# Patient Record
Sex: Male | Born: 1965 | Race: White | Hispanic: No | Marital: Single | Smoking: Current every day smoker
Health system: Southern US, Community
[De-identification: ages and names within clinical notes are randomized; demographics above are authoritative.]

## PROBLEM LIST (undated history)

## (undated) DIAGNOSIS — J45909 Unspecified asthma, uncomplicated: Secondary | ICD-10-CM

---

## 2019-08-12 ENCOUNTER — Emergency Department (HOSPITAL_COMMUNITY)
Admission: EM | Admit: 2019-08-12 | Discharge: 2019-08-12 | Disposition: A | Discharge status: Home / Self Care | Payer: Self-pay | Attending: Emergency Medicine | Admitting: Emergency Medicine

## 2019-08-12 ENCOUNTER — Emergency Department (HOSPITAL_COMMUNITY): Payer: Self-pay

## 2019-08-12 ENCOUNTER — Encounter (HOSPITAL_COMMUNITY): Payer: Self-pay

## 2019-08-12 ENCOUNTER — Other Ambulatory Visit: Payer: Self-pay

## 2019-08-12 DIAGNOSIS — R059 Cough, unspecified: Secondary | ICD-10-CM

## 2019-08-12 DIAGNOSIS — R519 Headache, unspecified: Secondary | ICD-10-CM | POA: Insufficient documentation

## 2019-08-12 DIAGNOSIS — R05 Cough: Secondary | ICD-10-CM | POA: Insufficient documentation

## 2019-08-12 DIAGNOSIS — J45909 Unspecified asthma, uncomplicated: Secondary | ICD-10-CM | POA: Insufficient documentation

## 2019-08-12 DIAGNOSIS — F17200 Nicotine dependence, unspecified, uncomplicated: Secondary | ICD-10-CM | POA: Insufficient documentation

## 2019-08-12 DIAGNOSIS — Z20822 Contact with and (suspected) exposure to covid-19: Secondary | ICD-10-CM

## 2019-08-12 DIAGNOSIS — M791 Myalgia, unspecified site: Secondary | ICD-10-CM | POA: Insufficient documentation

## 2019-08-12 DIAGNOSIS — R5381 Other malaise: Secondary | ICD-10-CM | POA: Insufficient documentation

## 2019-08-12 DIAGNOSIS — Z20828 Contact with and (suspected) exposure to other viral communicable diseases: Secondary | ICD-10-CM | POA: Insufficient documentation

## 2019-08-12 HISTORY — DX: Unspecified asthma, uncomplicated: J45.909

## 2019-08-12 LAB — INFLUENZA PANEL BY PCR (TYPE A & B)
Influenza A By PCR: NEGATIVE
Influenza B By PCR: NEGATIVE

## 2019-08-12 MED ORDER — PROCHLORPERAZINE MALEATE 10 MG PO TABS
10.0000 mg | ORAL_TABLET | Freq: Once | ORAL | Status: AC
  Administered 2019-08-12: 15:00:00 10 mg via ORAL
  Filled 2019-08-12: qty 1

## 2019-08-12 NOTE — Discharge Instructions (Addendum)
You were evaluated in the Emergency Department and after careful evaluation, we did not find any emergent condition requiring admission or further testing in the hospital.  Your exam/testing today is overall reassuring.  We suspect a viral infection, possibly the coronavirus.  We have tested you for the coronavirus as well as the flu and you should be contacted with abnormal results.  Please practice quarantine at home until you receive your results.  If positive you will need to quarantine further per the CDC recommendations.  Please return to the Emergency Department if you experience any worsening of your condition.  We encourage you to follow up with a primary care provider.  Thank you for allowing [place] to be a part of your care.

## 2019-08-12 NOTE — ED Provider Notes (Signed)
[facility] Emergency Department Provider Note MRN:  [id number]  Arrival date & time: 08/12/19     Chief Complaint   Cough and Fatigue   History of Present Illness   Nathaniel Mendez is a 53 y.o. year-old male with a history of asthma presenting to the ED with chief complaint of cough and fatigue.  2 or 3 days of cough, malaise, body aches, dull frontal headache which is gradual onset, denies fever.  Denies chest pain or shortness of breath, no abdominal pain, occasional nausea, no vomiting, no diarrhea.  Patient is currently staying at a recovery center for addiction and there has been a positive corona test.  Review of Systems  A complete 10 system review of systems was obtained and all systems are negative except as noted in the HPI and PMH.   Patient's Health History    Past Medical History:  Diagnosis Date  . Asthma     History reviewed. No pertinent surgical history.  No family history on file.  Social History   Socioeconomic History  . Marital status: Single    Spouse name: Not on file  . Number of children: Not on file  . Years of education: Not on file  . Highest education level: Not on file  Occupational History  . Not on file  Social Needs  . Financial resource strain: Not on file  . Food insecurity    Worry: Not on file    Inability: Not on file  . Transportation needs    Medical: Not on file    Non-medical: Not on file  Tobacco Use  . Smoking status: Current Every Day Smoker  . Smokeless tobacco: Never Used  Substance and Sexual Activity  . Alcohol use: Not Currently    Frequency: Never  . Drug use: Never  . Sexual activity: Not on file  Lifestyle  . Physical activity    Days per week: Not on file    Minutes per session: Not on file  . Stress: Not on file  Relationships  . Social Herbalist on phone: Not on file    Gets together: Not on file    Attends religious service: Not on file    Active member of  club or organization: Not on file    Attends meetings of clubs or organizations: Not on file    Relationship status: Not on file  . Intimate partner violence    Fear of current or ex partner: Not on file    Emotionally abused: Not on file    Physically abused: Not on file    Forced sexual activity: Not on file  Other Topics Concern  . Not on file  Social History Narrative  . Not on file     Physical Exam  Vital Signs and Nursing Notes reviewed Vitals:   08/12/19 1405  BP: 123/76  Pulse: 94  Resp: 20  Temp: 98.1 F (36.7 C)  SpO2: 99%    CONSTITUTIONAL: Well-appearing, NAD NEURO:  Alert and oriented x 3, no focal deficits EYES:  eyes equal and reactive ENT/NECK:  no LAD, no JVD CARDIO: Regular rate, well-perfused, normal S1 and S2 PULM:  CTAB no wheezing or rhonchi GI/GU:  normal bowel sounds, non-distended, non-tender MSK/SPINE:  No gross deformities, no edema SKIN:  no rash, atraumatic PSYCH:  Appropriate speech and behavior  Diagnostic and Interventional Summary    EKG Interpretation  Date/Time:  Wednesday August 12 2019 15:33:51 EDT Ventricular Rate:  67 PR Interval:    QRS Duration: 86 QT Interval:  382 QTC Calculation: 404 R Axis:   79 Text Interpretation: Sinus rhythm Confirmed by Kennis Carina [phone]) on 08/12/2019 3:44:26 PM      Labs Reviewed  NOVEL CORONAVIRUS, NAA (HOSP ORDER, SEND-OUT TO REF LAB; TAT 18-24 HRS)  INFLUENZA PANEL BY PCR (TYPE A & B)    DG Chest Port 1 View  Final Result      Medications  prochlorperazine (COMPAZINE) tablet 10 mg (10 mg Oral Given 08/12/19 1529)     Procedures  /  Critical Care Procedures  ED Course and Medical Decision Making  I have reviewed the triage vital signs and the nursing notes.  Pertinent labs & imaging results that were available during my care of the patient were reviewed by me and considered in my medical decision making (see below for details).  Highly suspicious for COVID-19, also  considering influenza, other viral process.  Will swab for Covid, flu, perform screening EKG and chest x-ray, anticipating discharge.    Elmer Sow. Pilar Plate, MD [facility] Emergency Medicine [facility] [email or web address]  Final Clinical Impressions(s) / ED Diagnoses     ICD-10-CM   1. Exposure to COVID-19 virus  Z20.828   2. Cough  R05 DG Chest Port 1 View    DG Chest Port 1 View    ED Discharge Orders    None      Discharge Instructions Discussed with and Provided to Patient:   Discharge Instructions     You were evaluated in the Emergency Department and after careful evaluation, we did not find any emergent condition requiring admission or further testing in the hospital.  Your exam/testing today is overall reassuring.  We suspect a viral infection, possibly the coronavirus.  We have tested you for the coronavirus as well as the flu and you should be contacted with abnormal results.  Please practice quarantine at home until you receive your results.  If positive you will need to quarantine further per the CDC recommendations.  Please return to the Emergency Department if you experience any worsening of your condition.  We encourage you to follow up with a primary care provider.  Thank you for allowing [place] to be a part of your care.       Sabas Sous, MD 08/12/19 [phone]

## 2019-08-12 NOTE — ED Notes (Signed)
X ray in room.

## 2019-08-12 NOTE — ED Triage Notes (Signed)
Pt presents with c/o cough, fatigue, body aches for approx one week. Pt also c/o night sweats and diarrhea. Pt reports he lives in a sober living facility and a resident tested positive for Covid last week.

## 2019-08-13 LAB — NOVEL CORONAVIRUS, NAA (HOSP ORDER, SEND-OUT TO REF LAB; TAT 18-24 HRS): SARS-CoV-2, NAA: NOT DETECTED

## 2019-08-18 ENCOUNTER — Telehealth (HOSPITAL_COMMUNITY): Payer: Self-pay

## 2020-08-30 IMAGING — DX DG CHEST 1V PORT
1 series · 1 of 1 positions shown · non-contrast
Comparison: None.

CLINICAL DATA: Cough, fatigue, body aches

EXAM:
PORTABLE CHEST 1 VIEW

[chest ap]
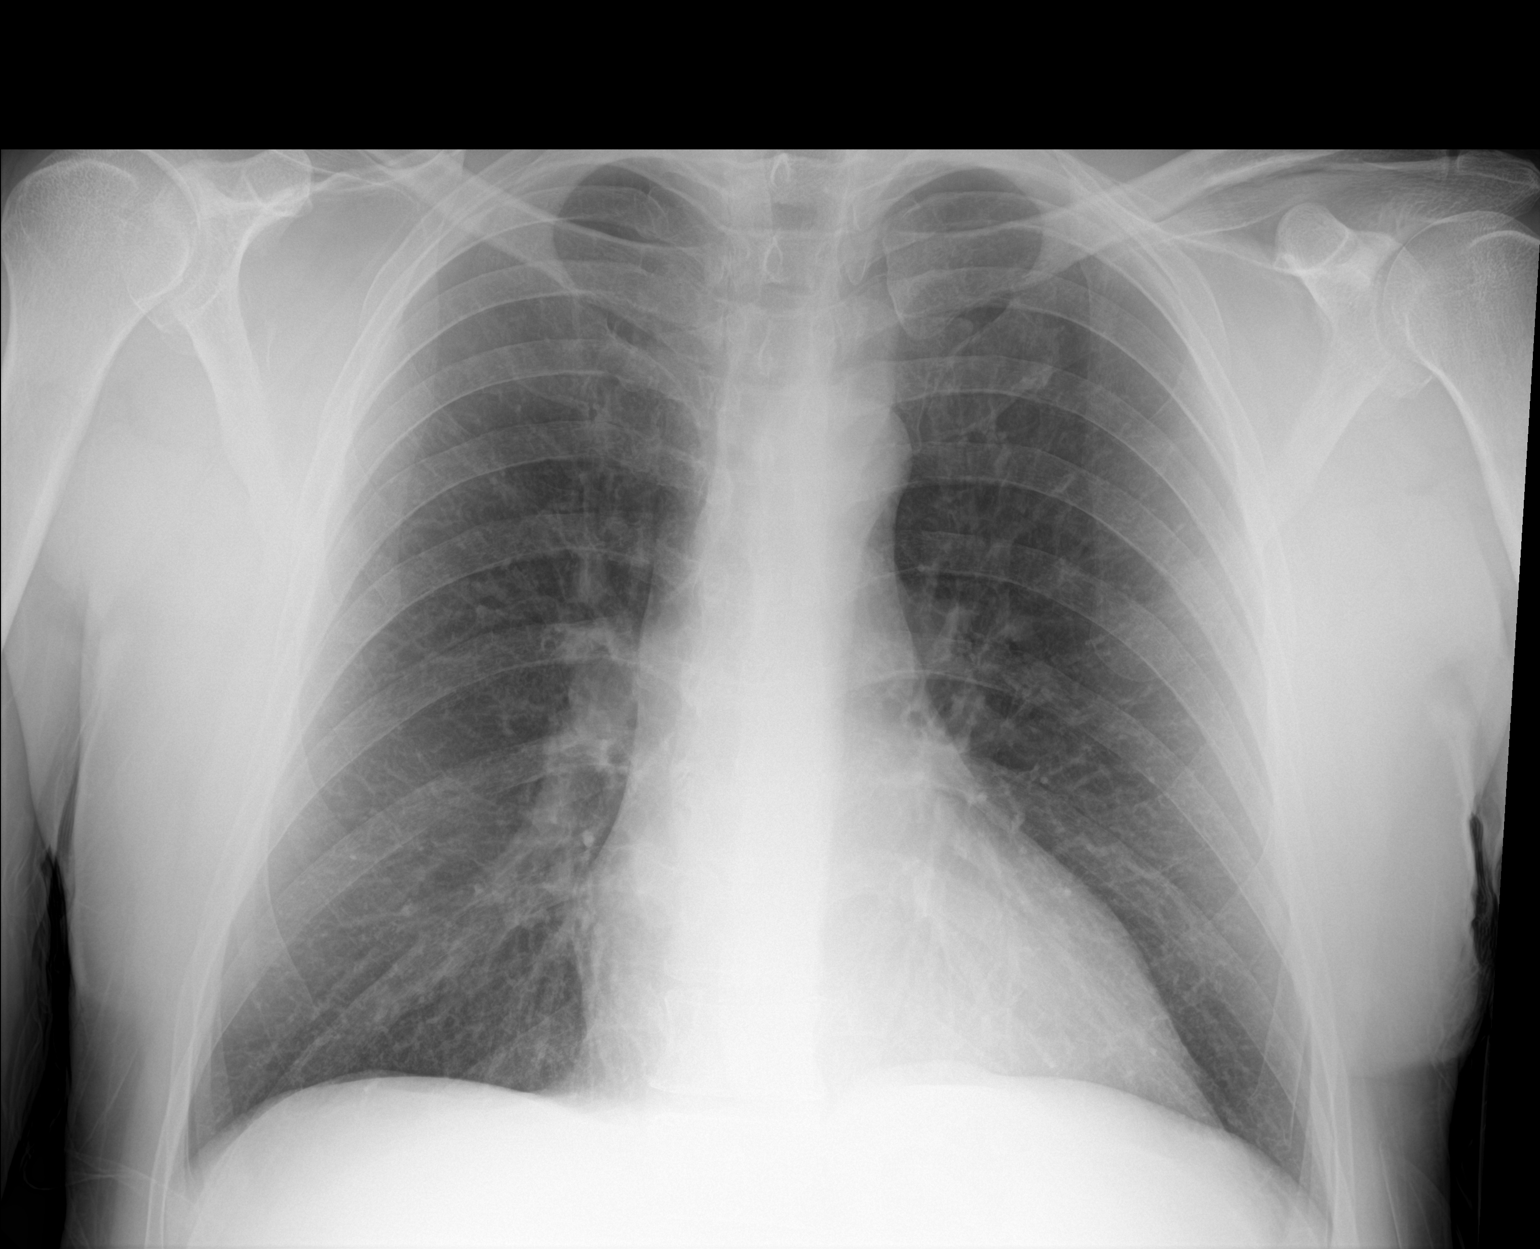

[1 of 1 positions shown; findings below may reference images not displayed]

FINDINGS: The heart size and mediastinal contours are within normal limits.
Both lungs are clear. The visualized skeletal structures are
unremarkable.
IMPRESSION: No active disease.

## 2021-01-10 ENCOUNTER — Other Ambulatory Visit: Payer: Self-pay

## 2021-01-10 ENCOUNTER — Emergency Department (HOSPITAL_COMMUNITY)
Admission: EM | Admit: 2021-01-10 | Discharge: 2021-01-10 | Disposition: A | Discharge status: Home / Self Care | Payer: Self-pay | Attending: Emergency Medicine | Admitting: Emergency Medicine

## 2021-01-10 DIAGNOSIS — Z760 Encounter for issue of repeat prescription: Secondary | ICD-10-CM | POA: Insufficient documentation

## 2021-01-10 DIAGNOSIS — F17200 Nicotine dependence, unspecified, uncomplicated: Secondary | ICD-10-CM | POA: Insufficient documentation

## 2021-01-10 DIAGNOSIS — R062 Wheezing: Secondary | ICD-10-CM | POA: Insufficient documentation

## 2021-01-10 DIAGNOSIS — R0602 Shortness of breath: Secondary | ICD-10-CM | POA: Insufficient documentation

## 2021-01-10 MED ORDER — ALBUTEROL SULFATE HFA 108 (90 BASE) MCG/ACT IN AERS
2.0000 | INHALATION_SPRAY | Freq: Once | RESPIRATORY_TRACT | Status: AC
  Administered 2021-01-10: 2 via RESPIRATORY_TRACT
  Filled 2021-01-10: qty 6.7

## 2021-01-10 MED ORDER — AEROCHAMBER Z-STAT PLUS/MEDIUM MISC
1.0000 | Freq: Once | Status: AC
  Administered 2021-01-10: 1
  Filled 2021-01-10: qty 1

## 2021-01-10 MED ORDER — ALBUTEROL SULFATE (2.5 MG/3ML) 0.083% IN NEBU: 2.5000 mg | INHALATION_SOLUTION | RESPIRATORY_TRACT | 12 refills | Status: AC | PRN

## 2021-01-10 NOTE — Discharge Instructions (Signed)
Thank you for allowing me to care for you today in the Emergency Department.   Use 2 puffs of your albuterol inhaler every 4 hours as needed for shortness of breath or wheezing.  Make sure to use the spacer that was demonstrated for you in the emergency department along with the inhaler to increase the effectiveness of the medication.  I have given you a refill of the nebulizer solution.  I have also placed a consult with our social worker to see about getting established with [facility], which we discussed in the emergency department.  They should give you a call tomorrow to see about getting you scheduled for more an appointment.  You can get your prescription filled at any pharmacy, but if you get established with [facility] they also have a pharmacy at the facility that may be more cost effective.  Return the emergency department if you develop chest pain, respiratory distress, if you pass out, if you start having a fever (temperature greater than 100.4 F) with chest pain or shortness of breath, or other new, concerning symptoms.

## 2021-01-10 NOTE — ED Triage Notes (Signed)
Pt presents from home, states he has been out of his inhaler and neb treatments for his asthma. Recent move here from [place] and has not gotten a PCP yet

## 2021-01-10 NOTE — ED Provider Notes (Signed)
[facility]-EMERGENCY DEPT Provider Note   CSN: [id number] Arrival date & time: 01/10/21  2124     History Chief Complaint  Patient presents with  . Medication Refill    Nathaniel Mendez is a 55 y.o. male with a history of asthma, tobacco use disorder, and crack cocaine use disorder in remission who presents the emergency department with a chief complaint of wheezing.  The patient reports that he recently moved to the area from [place] to participate in a sober living [place] program due to his history of crack cocaine use.  Crack cocaine use is currently in remission.  Today was his first day at the job and the job site was extremely dusty.  Throughout the day, he noticed worsening shortness of breath and wheezing.  Symptoms felt consistent with previous asthma exacerbations.  He went home and had 1 albuterol nebulizer left, which she used approximately 2000.  He reports complete resolution of shortness of breath.  However, he is out of his home albuterol inhaler, which he states that he uses at baseline 3-4 times a day, as well as any refills of his albuterol nebulizer solution.  He also reports that he does not currently have medical insurance and has not established with a healthcare provider since moving to the area.  He denies fever, chills, chest pain, leg swelling, palpitations, chest tightness, abdominal pain, nausea, vomiting, or diarrhea.  The history is provided by the patient. No language interpreter was used.       Past Medical History:  Diagnosis Date  . Asthma     There are no problems to display for this patient.   No past surgical history on file.     No family history on file.  Social History   Tobacco Use  . Smoking status: Current Every Day Smoker  . Smokeless tobacco: Never Used  Substance Use Topics  . Alcohol use: Not Currently  . Drug use: Never    Home Medications Prior to Admission medications   Medication Sig Start  Date End Date Taking? Authorizing Provider  albuterol (PROVENTIL) (2.5 MG/3ML) 0.083% nebulizer solution Take 3 mLs (2.5 mg total) by nebulization every 4 (four) hours as needed for wheezing or shortness of breath. 01/10/21  Yes Sharol Croghan A, PA-C    Allergies    Patient has no known allergies.  Review of Systems   Review of Systems  Constitutional: Negative for activity change, chills, diaphoresis and fever.  HENT: Negative for congestion and sore throat.   Eyes: Negative for visual disturbance.  Respiratory: Positive for cough, shortness of breath and wheezing. Negative for chest tightness.   Cardiovascular: Negative for chest pain, palpitations and leg swelling.  Gastrointestinal: Negative for abdominal pain, constipation, diarrhea, nausea and vomiting.  Musculoskeletal: Negative for back pain and myalgias.  Skin: Negative for rash and wound.  Neurological: Negative for dizziness, seizures, syncope, weakness and numbness.    Physical Exam Updated Vital Signs BP (!) 150/86   Pulse 97   Temp 98.2 F (36.8 C) (Oral)   Resp 17   Ht 5\' 8"  (1.727 m)   Wt 72.6 kg   SpO2 99%   BMI 24.33 kg/m   Physical Exam Vitals and nursing note reviewed.  Constitutional:      General: He is not in acute distress.    Appearance: He is well-developed. He is not ill-appearing, toxic-appearing or diaphoretic.  HENT:     Head: Normocephalic.  Eyes:     Conjunctiva/sclera: Conjunctivae  normal.  Cardiovascular:     Rate and Rhythm: Normal rate and regular rhythm.     Pulses: Normal pulses.     Heart sounds: No murmur heard. No friction rub. No gallop.   Pulmonary:     Effort: Pulmonary effort is normal. No respiratory distress.     Breath sounds: Normal breath sounds. No stridor. No wheezing, rhonchi or rales.     Comments: Scattered end expiratory wheezes in the left midlung and base.  Good air movement throughout.  Patient is able to speak in complete, fluent sentences without increased  work of breathing.  No rhonchi or rales. Chest:     Chest wall: No tenderness.  Abdominal:     General: There is no distension.     Palpations: Abdomen is soft.     Tenderness: There is no abdominal tenderness.  Musculoskeletal:     Cervical back: Neck supple.     Right lower leg: No edema.     Left lower leg: No edema.  Skin:    General: Skin is warm and dry.  Neurological:     Mental Status: He is alert.  Psychiatric:        Behavior: Behavior normal.     ED Results / Procedures / Treatments   Labs (all labs ordered are listed, but only abnormal results are displayed) Labs Reviewed - No data to display  EKG None  Radiology No results found.  Procedures Procedures   Medications Ordered in ED Medications  albuterol (VENTOLIN HFA) 108 (90 Base) MCG/ACT inhaler 2 puff (has no administration in time range)  aerochamber Z-Stat Plus/medium 1 each (has no administration in time range)    ED Course  I have reviewed the triage vital signs and the nursing notes.  Pertinent labs & imaging results that were available during my care of the patient were reviewed by me and considered in my medical decision making (see chart for details).    MDM Rules/Calculators/A&P                          55 year old male with a history of asthma, tobacco use disorder, and crack cocaine use disorder in remission who presents the emergency department with a chief complaint of wheezing and medication refill.  Patient endorses wheezing and shortness of breath, onset today after he started a new job with a dusty work environment.  Shortness of breath resolved after using his last albuterol nebulizer at home.  He does not have any additional refills for his nebulizer solution nor does he have an albuterol inhaler.  He recently relocated to the area for a sober living [place] program due to his history of crack cocaine use.  He does not currently have a PCP or healthcare insurance.  On exam, he has  scattered end expiratory wheezes in the left midlung and bases.  Good air movement throughout.  Will give albuterol inhaler and spacer in the ER.  He has been given a refill of his home nebulizer solution.  Will place transition of care consult to assist with PCP needs.  All questions answered.  Doubt COVID-19, pneumonia, ACS, PE, acute CHF.  He is hemodynamically stable no acute distress.  Safe discharge home with outpatient follow-up indicated.  Final Clinical Impression(s) / ED Diagnoses Final diagnoses:  Wheezing  Encounter for medication refill    Rx / DC Orders ED Discharge Orders         Ordered  albuterol (PROVENTIL) (2.5 MG/3ML) 0.083% nebulizer solution  Every 4 hours PRN        01/10/21 2224           Julia Kulzer A, PA-C 01/10/21 2235    Rolan Bucco, MD 01/10/21 2245

## 2021-02-21 ENCOUNTER — Other Ambulatory Visit: Payer: Self-pay

## 2021-02-21 ENCOUNTER — Encounter (HOSPITAL_COMMUNITY): Payer: Self-pay | Admitting: Emergency Medicine

## 2021-02-21 ENCOUNTER — Emergency Department (HOSPITAL_COMMUNITY)
Admission: EM | Admit: 2021-02-21 | Discharge: 2021-02-21 | Disposition: A | Discharge status: Home / Self Care | Payer: Self-pay | Attending: Emergency Medicine | Admitting: Emergency Medicine

## 2021-02-21 DIAGNOSIS — R0981 Nasal congestion: Secondary | ICD-10-CM | POA: Insufficient documentation

## 2021-02-21 DIAGNOSIS — J45909 Unspecified asthma, uncomplicated: Secondary | ICD-10-CM | POA: Insufficient documentation

## 2021-02-21 DIAGNOSIS — J029 Acute pharyngitis, unspecified: Secondary | ICD-10-CM

## 2021-02-21 DIAGNOSIS — Z20822 Contact with and (suspected) exposure to covid-19: Secondary | ICD-10-CM | POA: Insufficient documentation

## 2021-02-21 DIAGNOSIS — J069 Acute upper respiratory infection, unspecified: Secondary | ICD-10-CM | POA: Insufficient documentation

## 2021-02-21 DIAGNOSIS — F17200 Nicotine dependence, unspecified, uncomplicated: Secondary | ICD-10-CM | POA: Insufficient documentation

## 2021-02-21 LAB — SARS CORONAVIRUS 2 (TAT 6-24 HRS): SARS Coronavirus 2: NEGATIVE

## 2021-02-21 NOTE — ED Triage Notes (Signed)
Pt reports has allergies. Woke up with itchy eyes, sore throat with nasal congestion and post nasal drip. Work requires note for symptoms.

## 2021-02-21 NOTE — ED Provider Notes (Signed)
[facility]-EMERGENCY DEPT Provider Note   CSN: [id number] Arrival date & time: 02/21/21  1325     History Chief Complaint  Patient presents with  . Sore Throat    Nathaniel Mendez is a 55 y.o. male presented for evaluation of sore throat, nasal congestion, runny eyes.  Patient states that he woke up this morning, he had allergy symptoms.  He has a history of seasonal allergies, worse in the spring.  He is no longer on Sudafed which he used to take due to being in a recovery program.  He denies fever, chest pain, shortness breath, cough, nausea, vomiting.  He has been fully vaccinated for COVID including booster shot.  He has not received his flu shot.  He reports no other medical problems besides allergies.  He denies sick contacts.  He is here because he was unable to go to work today due to symptoms and requires a work note for this  HPI     Past Medical History:  Diagnosis Date  . Asthma     There are no problems to display for this patient.   History reviewed. No pertinent surgical history.     No family history on file.  Social History   Tobacco Use  . Smoking status: Current Every Day Smoker  . Smokeless tobacco: Never Used  Substance Use Topics  . Alcohol use: Not Currently  . Drug use: Never    Home Medications Prior to Admission medications   Medication Sig Start Date End Date Taking? Authorizing Provider  albuterol (PROVENTIL) (2.5 MG/3ML) 0.083% nebulizer solution Take 3 mLs (2.5 mg total) by nebulization every 4 (four) hours as needed for wheezing or shortness of breath. 01/10/21   McDonald, Mia A, PA-C    Allergies    Patient has no known allergies.  Review of Systems   Review of Systems  Constitutional: Negative for fever.  HENT: Positive for congestion and sore throat.   Respiratory: Negative for cough.     Physical Exam Updated Vital Signs BP 132/73 (BP Location: Left Arm)   Pulse 87   Temp 98.2 F (36.8 C)  (Oral)   Resp 16   SpO2 99%   Physical Exam Vitals and nursing note reviewed.  Constitutional:      General: He is not in acute distress.    Appearance: He is well-developed.     Comments: Resting in the bed in no acute distress  HENT:     Head: Normocephalic and atraumatic.     Comments: Mild erythema of OP.  No tonsillar swelling or exudate.  Uvula midline.    Mouth/Throat:     Mouth: Mucous membranes are moist.  Cardiovascular:     Rate and Rhythm: Normal rate and regular rhythm.     Pulses: Normal pulses.  Pulmonary:     Effort: Pulmonary effort is normal.     Breath sounds: Normal breath sounds.     Comments: Clear lung sounds Abdominal:     General: There is no distension.  Musculoskeletal:        General: Normal range of motion.     Cervical back: Normal range of motion.  Skin:    General: Skin is warm.     Findings: No rash.  Neurological:     Mental Status: He is alert and oriented to person, place, and time.     ED Results / Procedures / Treatments   Labs (all labs ordered are listed, but only abnormal results  are displayed) Labs Reviewed  SARS CORONAVIRUS 2 (TAT 6-24 HRS)    EKG None  Radiology No results found.  Procedures Procedures   Medications Ordered in ED Medications - No data to display  ED Course  I have reviewed the triage vital signs and the nursing notes.  Pertinent labs & imaging results that were available during my care of the patient were reviewed by me and considered in my medical decision making (see chart for details).    MDM Rules/Calculators/A&P                          Patient presenting for evaluation of sore throat, nasal congestion, eyes running.  On exam, patient appears nontoxic.  Likely allergic symptoms, however also consider early viral illness including COVID.  Send out COVID test is pending.  Discussed symptomatic treatment with patient.  At this time, patient appears safe for discharge.  Return precautions  given.  Patient states he understands and agrees to plan  Nathaniel Mendez was evaluated in Emergency Department on 02/21/2021 for the symptoms described in the history of present illness. He was evaluated in the context of the global COVID-19 pandemic, which necessitated consideration that the patient might be at risk for infection with the SARS-CoV-2 virus that causes COVID-19. Institutional protocols and algorithms that pertain to the evaluation of patients at risk for COVID-19 are in a state of rapid change based on information released by regulatory bodies including the CDC and federal and state organizations. These policies and algorithms were followed during the patient's care in the ED.  Final Clinical Impression(s) / ED Diagnoses Final diagnoses:  Sore throat  Nasal congestion    Rx / DC Orders ED Discharge Orders    None       Alveria Apley, PA-C 02/21/21 1351    Arby Barrette, MD 03/03/21 [phone]

## 2021-02-21 NOTE — Discharge Instructions (Signed)
Your symptoms are likely due to either allergies or early viral illness.  Either way, they get treated symptomatically. Make sure stay well-hydrated water. Use Tylenol or ibuprofen as needed for pain. I recommend you start allergy medication, Zyrtec, which he can pick up over-the-counter.  Take 10 mg once a day for the next 2 weeks. You tested for COVID today.  Results are pending, should return in 6 to 24 hours.  If positive, you should receive a phone call.  If negative, you will not.  Either way, you may check online on MyChart. Return to the emergency room if develop high fevers, difficulty breathing, severe chest pain, or any new, worsening, or concerning symptoms.
